# Patient Record
Sex: Female | Born: 1970 | Race: Black or African American | Hispanic: No | Marital: Single | Smoking: Never smoker
Health system: Southern US, Community
[De-identification: ages and names within clinical notes are randomized; demographics above are authoritative.]

## PROBLEM LIST (undated history)

## (undated) DIAGNOSIS — Z789 Other specified health status: Secondary | ICD-10-CM

---

## 2003-04-12 ENCOUNTER — Emergency Department (HOSPITAL_COMMUNITY): Admission: EM | Admit: 2003-04-12 | Discharge: 2003-04-12 | Payer: Self-pay | Admitting: Emergency Medicine

## 2003-08-30 ENCOUNTER — Other Ambulatory Visit: Admission: RE | Admit: 2003-08-30 | Discharge: 2003-08-30 | Payer: Self-pay | Admitting: Internal Medicine
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
## 2005-08-06 ENCOUNTER — Emergency Department (HOSPITAL_COMMUNITY): Admission: EM | Admit: 2005-08-06 | Discharge: 2005-08-06 | Payer: Self-pay | Admitting: *Deleted

## 2006-03-18 HISTORY — PX: MYOMECTOMY: SHX85

## 2011-03-04 ENCOUNTER — Other Ambulatory Visit: Payer: Self-pay | Admitting: Internal Medicine

## 2011-03-04 DIAGNOSIS — Z1231 Encounter for screening mammogram for malignant neoplasm of breast: Secondary | ICD-10-CM

## 2011-03-27 ENCOUNTER — Ambulatory Visit
Admission: RE | Admit: 2011-03-27 | Discharge: 2011-03-27 | Disposition: A | Discharge status: Home / Self Care | Payer: BC Managed Care – PPO | Source: Ambulatory Visit | Attending: Internal Medicine | Admitting: Internal Medicine

## 2011-03-27 DIAGNOSIS — Z1231 Encounter for screening mammogram for malignant neoplasm of breast: Secondary | ICD-10-CM

## 2011-04-10 ENCOUNTER — Other Ambulatory Visit: Payer: Self-pay | Admitting: Internal Medicine

## 2011-04-10 DIAGNOSIS — R928 Other abnormal and inconclusive findings on diagnostic imaging of breast: Secondary | ICD-10-CM

## 2011-04-17 ENCOUNTER — Ambulatory Visit
Admission: RE | Admit: 2011-04-17 | Discharge: 2011-04-17 | Disposition: A | Discharge status: Home / Self Care | Payer: BC Managed Care – PPO | Source: Ambulatory Visit | Attending: Internal Medicine | Admitting: Internal Medicine

## 2011-04-17 ENCOUNTER — Other Ambulatory Visit: Payer: Self-pay | Admitting: Internal Medicine

## 2011-04-17 DIAGNOSIS — R928 Other abnormal and inconclusive findings on diagnostic imaging of breast: Secondary | ICD-10-CM

## 2011-04-24 ENCOUNTER — Ambulatory Visit
Admission: RE | Admit: 2011-04-24 | Discharge: 2011-04-24 | Disposition: A | Discharge status: Home / Self Care | Payer: BC Managed Care – PPO | Source: Ambulatory Visit | Attending: Internal Medicine | Admitting: Internal Medicine

## 2011-04-24 ENCOUNTER — Other Ambulatory Visit: Payer: Self-pay | Admitting: Diagnostic Radiology

## 2011-04-24 DIAGNOSIS — R928 Other abnormal and inconclusive findings on diagnostic imaging of breast: Secondary | ICD-10-CM

## 2012-02-25 ENCOUNTER — Encounter (HOSPITAL_COMMUNITY): Payer: Self-pay | Admitting: Pharmacist

## 2012-03-03 ENCOUNTER — Encounter (HOSPITAL_COMMUNITY): Payer: Self-pay

## 2012-03-03 ENCOUNTER — Encounter (HOSPITAL_COMMUNITY)
Admission: RE | Admit: 2012-03-03 | Discharge: 2012-03-03 | Disposition: A | Discharge status: Home / Self Care | Payer: BC Managed Care – PPO | Source: Ambulatory Visit | Attending: Obstetrics and Gynecology | Admitting: Obstetrics and Gynecology

## 2012-03-03 HISTORY — DX: Other specified health status: Z78.9

## 2012-03-03 LAB — CBC
MCH: 24.2 pg — ABNORMAL LOW (ref 26.0–34.0)
Platelets: 303 10*3/uL (ref 150–400)
RBC: 4.42 MIL/uL (ref 3.87–5.11)
WBC: 5.7 10*3/uL (ref 4.0–10.5)

## 2012-03-03 LAB — SURGICAL PCR SCREEN: MRSA, PCR: NEGATIVE

## 2012-03-03 NOTE — Patient Instructions (Addendum)
[address] Albertina Senegal. [place]oski  03/03/2012   Your procedure is scheduled on:  03/09/12  Enter through the Main Entrance of [facility] at 6 AM.  Pick up the phone at the desk and dial [phone].   Call this number if you have problems the morning of surgery: [phone]   Remember:   Do not eat food:After Midnight.  Do not drink clear liquids: After Midnight.  Take these medicines the morning of surgery with A SIP OF WATER: NA   Do not wear jewelry, make-up or nail polish.  Do not wear lotions, powders, or perfumes. You may wear deodorant.  Do not shave 48 hours prior to surgery.  Do not bring valuables to the hospital.  Contacts, dentures or bridgework may not be worn into surgery.  Leave suitcase in the car. After surgery it may be brought to your room.  For patients admitted to the hospital, checkout time is 11:00 AM the day of discharge.   Patients discharged the day of surgery will not be allowed to drive home.  Name and phone number of your driver: NA  Special Instructions: Shower using CHG 2 nights before surgery and the night before surgery.  If you shower the day of surgery use CHG.  Use special wash - you have one bottle of CHG for all showers.  You should use approximately 1/3 of the bottle for each shower.   Please read over the following fact sheets that you were given: MRSA Information

## 2012-03-04 ENCOUNTER — Other Ambulatory Visit: Payer: Self-pay | Admitting: Obstetrics and Gynecology

## 2012-03-08 MED ORDER — CEFAZOLIN SODIUM-DEXTROSE 2-3 GM-% IV SOLR: 2.0000 g | INTRAVENOUS | Status: DC

## 2012-03-09 ENCOUNTER — Encounter (HOSPITAL_COMMUNITY): Payer: Self-pay

## 2012-03-09 ENCOUNTER — Ambulatory Visit (HOSPITAL_COMMUNITY): Payer: BC Managed Care – PPO

## 2012-03-09 ENCOUNTER — Encounter (HOSPITAL_COMMUNITY)
Admission: RE | Disposition: A | Discharge status: Home / Self Care | Payer: Self-pay | Source: Ambulatory Visit | Attending: Obstetrics and Gynecology

## 2012-03-09 ENCOUNTER — Ambulatory Visit (HOSPITAL_COMMUNITY)
Admission: RE | Admit: 2012-03-09 | Discharge: 2012-03-09 | Disposition: A | Discharge status: Home / Self Care | Payer: BC Managed Care – PPO | Source: Ambulatory Visit | Attending: Obstetrics and Gynecology | Admitting: Obstetrics and Gynecology

## 2012-03-09 DIAGNOSIS — N920 Excessive and frequent menstruation with regular cycle: Secondary | ICD-10-CM | POA: Insufficient documentation

## 2012-03-09 DIAGNOSIS — D252 Subserosal leiomyoma of uterus: Secondary | ICD-10-CM | POA: Insufficient documentation

## 2012-03-09 DIAGNOSIS — D250 Submucous leiomyoma of uterus: Secondary | ICD-10-CM | POA: Insufficient documentation

## 2012-03-09 DIAGNOSIS — Z01812 Encounter for preprocedural laboratory examination: Secondary | ICD-10-CM | POA: Insufficient documentation

## 2012-03-09 DIAGNOSIS — Z01818 Encounter for other preprocedural examination: Secondary | ICD-10-CM | POA: Insufficient documentation

## 2012-03-09 DIAGNOSIS — D251 Intramural leiomyoma of uterus: Secondary | ICD-10-CM | POA: Insufficient documentation

## 2012-03-09 DIAGNOSIS — Z90710 Acquired absence of both cervix and uterus: Secondary | ICD-10-CM

## 2012-03-09 HISTORY — PX: ROBOTIC ASSISTED TOTAL HYSTERECTOMY: SHX6085

## 2012-03-09 HISTORY — PX: BILATERAL SALPINGECTOMY: SHX5743

## 2012-03-09 LAB — BASIC METABOLIC PANEL
BUN: 7 mg/dL (ref 6–23)
CO2: 28 mEq/L (ref 19–32)
CO2: 25 mEq/L (ref 19–32)
Chloride: 101 mEq/L (ref 96–112)
Creatinine, Ser: 0.76 mg/dL (ref 0.50–1.10)
GFR calc non Af Amer: >90 mL/min (ref >90)
Glucose, Bld: 189 mg/dL — ABNORMAL HIGH (ref 70–99)
Potassium: 3.8 mEq/L (ref 3.5–5.1)
Potassium: 3.9 mEq/L (ref 3.5–5.1)

## 2012-03-09 LAB — CBC
HCT: 33 % — ABNORMAL LOW (ref 36.0–46.0)
Hemoglobin: 10.4 g/dL — ABNORMAL LOW (ref 12.0–15.0)
Hemoglobin: 9.4 g/dL — ABNORMAL LOW (ref 12.0–15.0)
MCH: 24.2 pg — ABNORMAL LOW (ref 26.0–34.0)
MCHC: 31.8 g/dL (ref 30.0–36.0)
MCV: 76.4 fL — ABNORMAL LOW (ref 78.0–100.0)
MCV: 76.3 fL — ABNORMAL LOW (ref 78.0–100.0)
RDW: 18.1 % — ABNORMAL HIGH (ref 11.5–15.5)
WBC: 6 10*3/uL (ref 4.0–10.5)

## 2012-03-09 SURGERY — ROBOTIC ASSISTED TOTAL HYSTERECTOMY
Anesthesia: General | Site: Abdomen | Laterality: Bilateral | Wound class: Clean Contaminated

## 2012-03-09 MED ORDER — DEXTROSE IN LACTATED RINGERS 5 % IV SOLN
INTRAVENOUS | Status: DC
  Administered 2012-03-09: 14:00:00 via INTRAVENOUS

## 2012-03-09 MED ORDER — PROPOFOL 10 MG/ML IV EMUL
INTRAVENOUS | Status: DC | PRN
  Administered 2012-03-09: 200 mg via INTRAVENOUS

## 2012-03-09 MED ORDER — IBUPROFEN 800 MG PO TABS: 800.0000 mg | ORAL_TABLET | Freq: Three times a day (TID) | ORAL | Status: DC | PRN

## 2012-03-09 MED ORDER — LIDOCAINE HCL (CARDIAC) 20 MG/ML IV SOLN
INTRAVENOUS | Status: DC | PRN
  Administered 2012-03-09: 100 mg via INTRAVENOUS

## 2012-03-09 MED ORDER — OXYCODONE-ACETAMINOPHEN 5-325 MG PO TABS: 1.0000 | ORAL_TABLET | ORAL | Status: DC | PRN

## 2012-03-09 MED ORDER — GLYCOPYRROLATE 0.2 MG/ML IJ SOLN
INTRAMUSCULAR | Status: AC
  Filled 2012-03-09: qty 3

## 2012-03-09 MED ORDER — ARTIFICIAL TEARS OP OINT
TOPICAL_OINTMENT | OPHTHALMIC | Status: AC
  Filled 2012-03-09: qty 3.5

## 2012-03-09 MED ORDER — FENTANYL CITRATE 0.05 MG/ML IJ SOLN
INTRAMUSCULAR | Status: AC
  Filled 2012-03-09: qty 2

## 2012-03-09 MED ORDER — MIDAZOLAM HCL 2 MG/2ML IJ SOLN
INTRAMUSCULAR | Status: AC
Start: 2012-03-09 — End: 2012-03-09
  Filled 2012-03-09: qty 2

## 2012-03-09 MED ORDER — ONDANSETRON HCL 4 MG/2ML IJ SOLN
INTRAMUSCULAR | Status: DC | PRN
  Administered 2012-03-09: 4 mg via INTRAVENOUS

## 2012-03-09 MED ORDER — FENTANYL CITRATE 0.05 MG/ML IJ SOLN
INTRAMUSCULAR | Status: AC
  Filled 2012-03-09: qty 5

## 2012-03-09 MED ORDER — MENTHOL 3 MG MT LOZG: 1.0000 | LOZENGE | OROMUCOSAL | Status: DC | PRN

## 2012-03-09 MED ORDER — PANTOPRAZOLE SODIUM 40 MG PO TBEC
40.0000 mg | DELAYED_RELEASE_TABLET | Freq: Every day | ORAL | Status: DC
  Filled 2012-03-09 (×2): qty 1

## 2012-03-09 MED ORDER — HYDROMORPHONE HCL 2 MG PO TABS: 4.0000 mg | ORAL_TABLET | ORAL | Status: DC | PRN

## 2012-03-09 MED ORDER — ONDANSETRON HCL 4 MG PO TABS: 4.0000 mg | ORAL_TABLET | Freq: Four times a day (QID) | ORAL | Status: DC | PRN

## 2012-03-09 MED ORDER — HYDROMORPHONE HCL PF 1 MG/ML IJ SOLN
0.2500 mg | INTRAMUSCULAR | Status: DC | PRN
  Administered 2012-03-09 (×2): 0.5 mg via INTRAVENOUS

## 2012-03-09 MED ORDER — LACTATED RINGERS IV SOLN
INTRAVENOUS | Status: DC
  Administered 2012-03-09: 06:00:00 via INTRAVENOUS

## 2012-03-09 MED ORDER — BUPIVACAINE HCL (PF) 0.25 % IJ SOLN
INTRAMUSCULAR | Status: DC | PRN
  Administered 2012-03-09: 11 mL

## 2012-03-09 MED ORDER — PROPOFOL 10 MG/ML IV EMUL
INTRAVENOUS | Status: AC
  Filled 2012-03-09: qty 20

## 2012-03-09 MED ORDER — LIDOCAINE HCL (CARDIAC) 20 MG/ML IV SOLN: INTRAVENOUS | Status: DC | PRN

## 2012-03-09 MED ORDER — FENTANYL CITRATE 0.05 MG/ML IJ SOLN
INTRAMUSCULAR | Status: DC | PRN
  Administered 2012-03-09: 50 ug via INTRAVENOUS
  Administered 2012-03-09: 200 ug via INTRAVENOUS
  Administered 2012-03-09: 50 ug via INTRAVENOUS

## 2012-03-09 MED ORDER — ZOLPIDEM TARTRATE 5 MG PO TABS: 5.0000 mg | ORAL_TABLET | Freq: Every evening | ORAL | Status: DC | PRN

## 2012-03-09 MED ORDER — ONDANSETRON HCL 4 MG/2ML IJ SOLN
4.0000 mg | Freq: Four times a day (QID) | INTRAMUSCULAR | Status: DC | PRN
  Administered 2012-03-09: 4 mg via INTRAVENOUS
  Filled 2012-03-09: qty 2

## 2012-03-09 MED ORDER — HYDROMORPHONE HCL PF 1 MG/ML IJ SOLN: 0.2000 mg | INTRAMUSCULAR | Status: DC | PRN

## 2012-03-09 MED ORDER — ROCURONIUM BROMIDE 100 MG/10ML IV SOLN
INTRAVENOUS | Status: DC | PRN
  Administered 2012-03-09: 50 mg via INTRAVENOUS
  Administered 2012-03-09 (×2): 5 mg via INTRAVENOUS

## 2012-03-09 MED ORDER — EPHEDRINE SULFATE 50 MG/ML IJ SOLN
INTRAMUSCULAR | Status: DC | PRN
  Administered 2012-03-09 (×3): 5 mg via INTRAVENOUS

## 2012-03-09 MED ORDER — CEFAZOLIN SODIUM-DEXTROSE 2-3 GM-% IV SOLR
INTRAVENOUS | Status: AC
  Administered 2012-03-09: 2 g via INTRAVENOUS
  Filled 2012-03-09: qty 50

## 2012-03-09 MED ORDER — KETOROLAC TROMETHAMINE 30 MG/ML IJ SOLN
30.0000 mg | Freq: Four times a day (QID) | INTRAMUSCULAR | Status: DC
  Administered 2012-03-09: 30 mg via INTRAVENOUS

## 2012-03-09 MED ORDER — HYDROMORPHONE HCL 4 MG PO TABS: 4.0000 mg | ORAL_TABLET | ORAL | Status: DC | PRN

## 2012-03-09 MED ORDER — PROPOFOL 10 MG/ML IV BOLUS: INTRAVENOUS | Status: DC | PRN

## 2012-03-09 MED ORDER — NEOSTIGMINE METHYLSULFATE 1 MG/ML IJ SOLN
INTRAMUSCULAR | Status: AC
  Filled 2012-03-09: qty 1

## 2012-03-09 MED ORDER — IBUPROFEN 800 MG PO TABS: 800.0000 mg | ORAL_TABLET | Freq: Four times a day (QID) | ORAL | Status: DC | PRN

## 2012-03-09 MED ORDER — BUPIVACAINE HCL (PF) 0.25 % IJ SOLN
INTRAMUSCULAR | Status: AC
  Filled 2012-03-09: qty 30

## 2012-03-09 MED ORDER — LACTATED RINGERS IR SOLN
Status: DC | PRN
  Administered 2012-03-09: 3000 mL

## 2012-03-09 MED ORDER — KETOROLAC TROMETHAMINE 30 MG/ML IJ SOLN
INTRAMUSCULAR | Status: AC
  Filled 2012-03-09: qty 1

## 2012-03-09 MED ORDER — MIDAZOLAM HCL 5 MG/5ML IJ SOLN
INTRAMUSCULAR | Status: DC | PRN
  Administered 2012-03-09: 2 mg via INTRAVENOUS

## 2012-03-09 MED ORDER — EPHEDRINE 5 MG/ML INJ
INTRAVENOUS | Status: AC
  Filled 2012-03-09: qty 10

## 2012-03-09 MED ORDER — ACETAMINOPHEN 10 MG/ML IV SOLN
INTRAVENOUS | Status: AC
  Administered 2012-03-09: 1000 mg via INTRAVENOUS
  Filled 2012-03-09: qty 100

## 2012-03-09 MED ORDER — ARTIFICIAL TEARS OP OINT
TOPICAL_OINTMENT | OPHTHALMIC | Status: DC | PRN
  Administered 2012-03-09: 1 via OPHTHALMIC

## 2012-03-09 MED ORDER — KETOROLAC TROMETHAMINE 30 MG/ML IJ SOLN: 30.0000 mg | Freq: Four times a day (QID) | INTRAMUSCULAR | Status: DC

## 2012-03-09 MED ORDER — ONDANSETRON HCL 4 MG/2ML IJ SOLN
INTRAMUSCULAR | Status: AC
  Filled 2012-03-09: qty 2

## 2012-03-09 MED ORDER — HYDROMORPHONE HCL PF 1 MG/ML IJ SOLN
INTRAMUSCULAR | Status: AC
  Administered 2012-03-09: 0.5 mg via INTRAVENOUS
  Filled 2012-03-09: qty 1

## 2012-03-09 SURGICAL SUPPLY — 63 items
BAG URINE DRAINAGE (UROLOGICAL SUPPLIES) ×2 IMPLANT
BARRIER ADHS 3X4 INTERCEED (GAUZE/BANDAGES/DRESSINGS) IMPLANT
CABLE HIGH FREQUENCY MONO STRZ (ELECTRODE) ×2 IMPLANT
CATH FOLEY 3WAY  5CC 16FR (CATHETERS) ×1
CATH FOLEY 3WAY 5CC 16FR (CATHETERS) ×1 IMPLANT
CHLORAPREP W/TINT 26ML (MISCELLANEOUS) ×2 IMPLANT
CLOTH BEACON ORANGE TIMEOUT ST (SAFETY) ×2 IMPLANT
CONT PATH 16OZ SNAP LID 3702 (MISCELLANEOUS) ×2 IMPLANT
COVER MAYO STAND STRL (DRAPES) ×2 IMPLANT
COVER TABLE BACK 60X90 (DRAPES) ×4 IMPLANT
COVER TIP SHEARS 8 DVNC (MISCELLANEOUS) ×1 IMPLANT
COVER TIP SHEARS 8MM DA VINCI (MISCELLANEOUS) ×1
DECANTER SPIKE VIAL GLASS SM (MISCELLANEOUS) ×2 IMPLANT
DERMABOND ADVANCED (GAUZE/BANDAGES/DRESSINGS) ×1
DERMABOND ADVANCED .7 DNX12 (GAUZE/BANDAGES/DRESSINGS) ×1 IMPLANT
DEVICE TROCAR PUNCTURE CLOSURE (ENDOMECHANICALS) IMPLANT
DRAPE HUG U DISPOSABLE (DRAPE) ×2 IMPLANT
DRAPE LG THREE QUARTER DISP (DRAPES) ×4 IMPLANT
DRAPE WARM FLUID 44X44 (DRAPE) ×2 IMPLANT
ELECT REM PT RETURN 9FT ADLT (ELECTROSURGICAL) ×2
ELECTRODE REM PT RTRN 9FT ADLT (ELECTROSURGICAL) ×1 IMPLANT
EVACUATOR SMOKE 8.L (FILTER) ×2 IMPLANT
GAUZE VASELINE 3X9 (GAUZE/BANDAGES/DRESSINGS) IMPLANT
GLOVE BIO SURGEON STRL SZ 6.5 (GLOVE) ×4 IMPLANT
GLOVE BIOGEL PI IND STRL 7.0 (GLOVE) ×3 IMPLANT
GLOVE BIOGEL PI INDICATOR 7.0 (GLOVE) ×3
GOWN STRL REIN XL XLG (GOWN DISPOSABLE) ×12 IMPLANT
KIT ACCESSORY DA VINCI DISP (KITS) ×1
KIT ACCESSORY DVNC DISP (KITS) ×1 IMPLANT
LEGGING LITHOTOMY PAIR STRL (DRAPES) ×2 IMPLANT
NEEDLE INSUFFLATION 14GA 120MM (NEEDLE) ×2 IMPLANT
OCCLUDER COLPOPNEUMO (BALLOONS) IMPLANT
PACK LAVH (CUSTOM PROCEDURE TRAY) ×2 IMPLANT
PAD PREP 24X48 CUFFED NSTRL (MISCELLANEOUS) ×4 IMPLANT
PLUG CATH AND CAP STER (CATHETERS) ×2 IMPLANT
PROTECTOR NERVE ULNAR (MISCELLANEOUS) ×4 IMPLANT
SCISSORS LAP 5X35 DISP (ENDOMECHANICALS) IMPLANT
SET CYSTO W/LG BORE CLAMP LF (SET/KITS/TRAYS/PACK) IMPLANT
SET IRRIG TUBING LAPAROSCOPIC (IRRIGATION / IRRIGATOR) ×2 IMPLANT
SOLUTION ELECTROLUBE (MISCELLANEOUS) ×2 IMPLANT
SUT VIC AB 0 CT1 27 (SUTURE) ×3
SUT VIC AB 0 CT1 27XBRD ANTBC (SUTURE) ×3 IMPLANT
SUT VICRYL 0 27 CT2 27 ABS (SUTURE) ×10 IMPLANT
SUT VICRYL 0 UR6 27IN ABS (SUTURE) ×2 IMPLANT
SUT VICRYL 4-0 PS2 18IN ABS (SUTURE) ×8 IMPLANT
SYR 50ML LL SCALE MARK (SYRINGE) ×2 IMPLANT
SYRINGE 10CC LL (SYRINGE) ×2 IMPLANT
SYSTEM CONVERTIBLE TROCAR (TROCAR) IMPLANT
TIP RUMI ORANGE 6.7MMX12CM (TIP) IMPLANT
TIP UTERINE 5.1X6CM LAV DISP (MISCELLANEOUS) ×2 IMPLANT
TIP UTERINE 6.7X10CM GRN DISP (MISCELLANEOUS) IMPLANT
TIP UTERINE 6.7X6CM WHT DISP (MISCELLANEOUS) ×2 IMPLANT
TIP UTERINE 6.7X8CM BLUE DISP (MISCELLANEOUS) ×2 IMPLANT
TOWEL OR 17X24 6PK STRL BLUE (TOWEL DISPOSABLE) ×6 IMPLANT
TROCAR 12M 150ML BLUNT (TROCAR) IMPLANT
TROCAR DISP BLADELESS 8 DVNC (TROCAR) ×1 IMPLANT
TROCAR DISP BLADELESS 8MM (TROCAR) ×1
TROCAR XCEL 12X100 BLDLESS (ENDOMECHANICALS) ×2 IMPLANT
TROCAR XCEL NON-BLD 5MMX100MML (ENDOMECHANICALS) ×2 IMPLANT
TROCAR Z-THREAD 12X150 (TROCAR) ×2 IMPLANT
TUBING FILTER THERMOFLATOR (ELECTROSURGICAL) ×2 IMPLANT
WARMER LAPAROSCOPE (MISCELLANEOUS) ×2 IMPLANT
WATER STERILE IRR 1000ML POUR (IV SOLUTION) ×6 IMPLANT

## 2012-03-09 NOTE — Anesthesia Preprocedure Evaluation (Addendum)
Anesthesia Evaluation  Patient identified by MRN, date of birth, ID band Patient awake    Reviewed: Allergy & Precautions, H&P , NPO status , Patient's Chart, lab work & pertinent test results, reviewed documented beta blocker date and time   History of Anesthesia Complications Negative for: history of anesthetic complications  Airway Mallampati: II TM Distance: >3 FB Neck ROM: full    Dental No notable dental hx. (+) Teeth Intact   Pulmonary neg pulmonary ROS,  breath sounds clear to auscultation  Pulmonary exam normal       Cardiovascular Exercise Tolerance: Good negative cardio ROS  Rhythm:regular Rate:Normal     Neuro/Psych negative neurological ROS  negative psych ROS   GI/Hepatic negative GI ROS, Neg liver ROS,   Endo/Other  negative endocrine ROS  Renal/GU negative Renal ROS  Female GU complaint     Musculoskeletal   Abdominal   Peds  Hematology  (+) anemia ,   Anesthesia Other Findings   Reproductive/Obstetrics negative OB ROS                          Anesthesia Physical Anesthesia Plan  ASA: II  Anesthesia Plan: General ETT   Post-op Pain Management:    Induction: Intravenous  Airway Management Planned: Oral ETT  Additional Equipment:   Intra-op Plan:   Post-operative Plan:   Informed Consent: I have reviewed the patients History and Physical, chart, labs and discussed the procedure including the risks, benefits and alternatives for the proposed anesthesia with the patient or authorized representative who has indicated his/her understanding and acceptance.   Dental Advisory Given and Dental advisory given  Plan Discussed with: CRNA and Surgeon  Anesthesia Plan Comments: (  Discussed  general anesthesia, including possible nausea, instrumentation of airway, sore throat,pulmonary aspiration, etc. I asked if the were any outstanding questions, or  concerns before  we proceeded. )       Anesthesia Quick Evaluation

## 2012-03-09 NOTE — Anesthesia Postprocedure Evaluation (Signed)
  Anesthesia Post-op Note  Patient: Roberta Mejia. [place]land  Procedure(s) Performed: Procedure(s) (LRB) with comments: ROBOTIC ASSISTED TOTAL HYSTERECTOMY (Bilateral) - 2 1/2 hrs. BILATERAL SALPINGECTOMY (Bilateral)  Patient Location: PACU and Women's Unit  Anesthesia Type:General  Level of Consciousness: awake, alert , oriented and patient cooperative  Airway and Oxygen Therapy: Patient Spontanous Breathing and Patient connected to nasal cannula oxygen  Post-op Pain: mild  Post-op Assessment: Post-op Vital signs reviewed, Patient's Cardiovascular Status Stable, Respiratory Function Stable, Patent Airway, No signs of Nausea or vomiting and Adequate PO intake  Post-op Vital Signs: Reviewed and stable  Complications: No apparent anesthesia complications

## 2012-03-09 NOTE — Addendum Note (Signed)
Addendum  created 03/09/12 1715 by Collier Flowers, CRNA   Modules edited:Notes Section

## 2012-03-09 NOTE — Progress Notes (Signed)
Pt. Is discharged in the care of Father. Downstairs per wheel chair. Stable. L abdominal lapsites are clean and dry. Scanty amt of vaginal bleeding noted on VPad. Discharge instructions were given , with good pt understanding. Questions were asked and answered.

## 2012-03-09 NOTE — Brief Op Note (Signed)
03/09/2012  10:21 AM  PATIENT:  Roberta Mejia. Pierotti  41 y.o. female  PRE-OPERATIVE DIAGNOSIS:  Fibroid uterus, Menorrhagia  POST-OPERATIVE DIAGNOSIS:  fibroid uterus,menorrhagia  PROCEDURE:  DaVinci robotic total hysterectomy, bilateral salpingectomy  SURGEON:  Surgeon(s) and Role:    * Serita Kyle, MD - Primary  PHYSICIAN ASSISTANT:   ASSISTANTS: Shea Evans, M.D.   ANESTHESIA:   general FINDINGS; nl tubes and ovaries, shortened uteroovarian ligaments, fibroid uterus, bowel adherent to bladder peritoneum on the right, appendix w/ some periappendiceal adhesions, nl liver edge EBL:  Total I/O In: 500 [I.V.:500] Out: 100 [Urine:100]  BLOOD ADMINISTERED:none  DRAINS: none   LOCAL MEDICATIONS USED:  MARCAINE     SPECIMEN:  Source of Specimen:  uterus with cervix, tubes  DISPOSITION OF SPECIMEN:  PATHOLOGY  COUNTS:  YES  TOURNIQUET:  * No tourniquets in log *  DICTATION: .Other Dictation: Dictation Number [id number]  PLAN OF CARE: Discharge to home after PACU  PATIENT DISPOSITION:  PACU - hemodynamically stable.   Delay start of Pharmacological VTE agent (>24hrs) due to surgical blood loss or risk of bleeding: no

## 2012-03-09 NOTE — Anesthesia Postprocedure Evaluation (Signed)
  Anesthesia Post-op Note  Patient: Roberta Mejia. [place]ciaga  Procedure(s) Performed: Procedure(s) (LRB) with comments: ROBOTIC ASSISTED TOTAL HYSTERECTOMY (Bilateral) - 2 1/2 hrs. BILATERAL SALPINGECTOMY (Bilateral) Patient is awake and responsive. Pain and nausea are reasonably well controlled. Vital signs are stable and clinically acceptable. Oxygen saturation is clinically acceptable. There are no apparent anesthetic complications at this time. Patient is ready for discharge.

## 2012-03-09 NOTE — Addendum Note (Signed)
Addendum  created 03/09/12 1225 by Armanda Heritage, RN   Modules edited:Charges VN

## 2012-03-09 NOTE — Transfer of Care (Signed)
Immediate Anesthesia Transfer of Care Note  Patient: Roberta Mejia. [place]  Procedure(s) Performed: Procedure(s) (LRB) with comments: ROBOTIC ASSISTED TOTAL HYSTERECTOMY (Bilateral) - 2 1/2 hrs. BILATERAL SALPINGECTOMY (Bilateral)  Patient Location: PACU  Anesthesia Type:General  Level of Consciousness: awake, alert  and oriented  Airway & Oxygen Therapy: Patient Spontanous Breathing and Patient connected to nasal cannula oxygen  Post-op Assessment: Report given to PACU RN  Post vital signs: Reviewed and stable  Complications: No apparent anesthesia complications

## 2012-03-09 NOTE — Addendum Note (Signed)
Addendum  created 03/09/12 1225 by Nylia Gavina M Christophr Calix, RN   Modules edited:Charges VN    

## 2012-03-09 NOTE — Anesthesia Procedure Notes (Signed)
Procedure Name: Intubation Date/Time: 03/09/2012 7:41 AM Performed by: Aydan Levitz, Jannet Askew Pre-anesthesia Checklist: Patient identified, Patient being monitored, Emergency Drugs available, Timeout performed and Suction available Patient Re-evaluated:Patient Re-evaluated prior to inductionOxygen Delivery Method: Circle system utilized Preoxygenation: Pre-oxygenation with 100% oxygen Intubation Type: IV induction Ventilation: Mask ventilation without difficulty Grade View: Grade II Tube type: Oral Tube size: 7.0 mm Placement Confirmation: ETT inserted through vocal cords under direct vision,  positive ETCO2,  CO2 detector and breath sounds checked- equal and bilateral Secured at: 22 cm Dental Injury: Teeth and Oropharynx as per pre-operative assessment

## 2012-03-10 NOTE — Op Note (Signed)
NAMEBENTLY, MORATH              ACCOUNT NO.:  [id number]  MEDICAL RECORD NO.:  [id number]  LOCATION:  9305                          FACILITY:  [facility]  PHYSICIAN:  Maxie Better, M.D.DATE OF BIRTH:  12-26-1970  DATE OF PROCEDURE:  03/09/2012 DATE OF DISCHARGE:  03/09/2012                              OPERATIVE REPORT   PREOPERATIVE DIAGNOSES:  Menorrhagia, uterine fibroids.  PROCEDURE:  Da Vinci robotic total hysterectomy, bilateral salpingectomy.  POSTOPERATIVE DIAGNOSES:  Menorrhagia, uterine fibroids, bowel adhesions.  ANESTHESIA:  General.  SURGEON:  Maxie Better, M.D.  ASSISTANT:  Darryl Nestle, MD.  PROCEDURE IN DETAIL:  Under adequate general anesthesia, the patient was placed in the dorsal lithotomy position.  She was positioned for robotic surgery.  Examination under anesthesia revealed a 10-12 week size, irregular, anteverted uterus.  No adnexal masses could be appreciated. The patient was ChloraPrep and Betadine prepped in the usual fashion and the patient was draped.  A three-way indwelling Foley catheter was placed.  A weighted speculum placed in the vagina.  Sims retractor was placed anteriorly.  The 0 Vicryl figure-of-eight sutures were placed on the anterior and posterior lip of the cervix.  The uterus sounded to 7 cm.  The initial placement of a #6 uterine manipulator resulted in a balloon bursting, therefore, the cervix was further dilated and sounded again.  An #8 uterine manipulator was introduced through the small RUMI KOH ring for manipulation of the uterus.  The weighted- Speculum and  retractor was removed.  Attention was then turned to the abdomen.  A 0.25% Marcaine was injected along the planned incision site. A vertical suprapubic incision was then made.  Veress needle was introduced, tested.  Opening pressure of 3 was noted.  A 3.5 L of CO2 was insufflated.  Veress needle was then removed.  A #12 mm disposable trocar and sleeve was  introduced into the abdomen without incident.  The robotic camera was then placed through that port.  The patient was advanced on the table and then placed in deep Trendelenburg.  Normal liver edges noted.  The uterus was irregular with evidence of fibroids.  There was a loop of small bowel draped over the anterior right aspect of the uterus.  At that point, two 8 mm port site was placed 10 cm apart on the left and one 8 mm port site was placed on the right with a 5-mm assistant port being placed, all under direct visualization.  The robot was then docked to the patient's left side and the #1 arm had the monopolar scissors place, in #2 arm had a probe PK dissector, and #3 arm had the Prograsp placed.  I then went to the surgical console.  At the surgical console, the pelvis was inspected.  Indeed, the bowels was draped over the uterus on the right and attached to the peritoneum of the bladder laterally.  The tubes and ovaries were normal bilaterally. The procedure was began by carefully dissected off the bowels off of its adhesion to the bladder.  Once that was done, attention was then turned to the right side where the ureter was noted to be seen peristalsing. Nonetheless, the retroperitoneal space was opened confirming  peristalsis of the ureter and its location deep in the pelvis.  The mesosalpinx of the right tube was then serially clamped, cauterized, and then cut, followed by a window was placed in the posterior leaf and the utero- ovarian ligament, which was short was serially clamped, cauterized, and then cut.  Once that was done, the further dissection was done to displace the ureter further inferiorly.  The round ligament on the right was then serially clamped, cauterized, and cut, and anterior leaf of the broad ligament at that point was also opened and carried over to the bladder.  Vesicouterine peritoneum was then sharply dissected.  The uterine vessels were noted to be  provident and skeletonized.  The bladder was dissected off the lower uterine segment and a sharp dissection and displaced inferiorly.  The uterine vessels on the right was clamped, cauterized, but not cut initially.  Continued dissection occurred on the right side, and further cauterization the uterine vessels were then performed.  The uterine vessels were then cut. Attention was turned to the opposite side where again the retroperitoneal space on the left was opened.  Blunt dissection was performed.  The ureter was then found to peristalsing and low in the pelvis.  The mesosalpinx of the left fallopian tube was then serially clamped, cauterized, and then cut until the utero-ovarian ligament was reached which was very short and therefore a window was placed in the peritoneum way above the ureter. The utero-ovarian ligaments were then serially clamped, cauterized, and then cut until that ovary was separated from its uterine attachment.  The uterine vessels on the left was then skeletonized.  Reinspection confirmed the ureter was deep the pelvis.  The remaining vesicouterine peritoneum was opened transversely and sharply dissected off the lower uterine segment.  Uterine vessels on the left were serially clamped, cauterized, and then cut.  The cup of the KOH ring was identified and circumferential incision was then made with the cervix detached from the vagina.  The specimen was then brought out through the vagina.  The vaginal cuff was carefully cauterized for bleeders. The bladder was again sharply dissected off that portion of the vaginal cuff and  displaced inferiorly.  At that point, the vaginal cuff was noted to be hemostased.  The PK and the monopolar scissors were removed and were replaced by a long-tip forceps and a large needle driver.  Zero Vicryl figure-of-eight sutures was used to close the vaginal cuff transversely. Once this was done, the pelvis was not irrigated as the needles  remained on the part on the sidewalls.  The small amount of fluid was removed. The hemostasis was achieved.  The appendix was noted to have some periappendiceal scar tissue but otherwise normal.  The robotic instruments were then removed.  The robot was undocked, and I went back to the patient's bedside.  The pelvis was irrigated, suctioned of debris. Small bleeding on the left ovarian pedicle was cauterized.  The needles x8 were removed using 8 mm scope and irrigation and suctioned of the debris was done.  The port sites were then removed under direct visualization. Abdomen was deflated and the incision was closed with 0 Vicryl, figure- of-eight suture for the fascial layer.  Remaining incisions were closed with 4-0 Vicryl subcuticular stitches.  Bimanual palpation of the vaginal cuff showed it to be well-approximated.  SPECIMEN:  Uterus, cervix, fallopian tubes x2, sent to pathology.  ESTIMATED BLOOD LOSS:  Less than 50 mL.  INTRAOPERATIVE FLUID:  500 mL.  URINE  OUTPUT:  100 mL clear yellow urine.  COUNTS:  Sponge and instrument counts x2 was correct.  COMPLICATIONS:  None.  The patient tolerated the procedure well was transferred to recovery in stable condition.    Maxie Better, M.D.    [place]/MEDQ  D:  03/09/2012  T:  03/10/2012  Job:  [id number]

## 2012-03-13 ENCOUNTER — Encounter (HOSPITAL_COMMUNITY): Payer: Self-pay | Admitting: Obstetrics and Gynecology

## 2018-03-26 DIAGNOSIS — E669 Obesity, unspecified: Secondary | ICD-10-CM | POA: Insufficient documentation

## 2019-06-02 ENCOUNTER — Other Ambulatory Visit: Payer: Self-pay

## 2019-06-02 ENCOUNTER — Encounter: Payer: Self-pay | Admitting: Cardiovascular Disease

## 2019-06-02 ENCOUNTER — Ambulatory Visit: Payer: BC Managed Care – PPO | Admitting: Cardiovascular Disease

## 2019-06-02 VITALS — BP 138/88 | HR 78 | Ht 65.0 in | Wt 200.0 lb

## 2019-06-02 DIAGNOSIS — I10 Essential (primary) hypertension: Secondary | ICD-10-CM | POA: Diagnosis not present

## 2019-06-02 HISTORY — DX: Essential (primary) hypertension: I10

## 2019-06-02 NOTE — Progress Notes (Signed)
Hypertension Clinic Initial Assessment:    Date:  06/02/2019   ID:  Roberta Mejia. Roberta, Mejia June 23, 1970, MRN [id number]  PCP:  Gregor Hams, FNP  Cardiologist:  No primary care provider on file.  Nephrologist:  Referring MD: Reeves Dam, MD   CC: Hypertension  History of Present Illness:    Roberta Pezzuti. Mejia is a 49 y.o. female with a hx of hypertension here to establish care in the hypertension clinic. She was initially diagnosed with hypertension approximately one year ago.  Her BP has been somewhat labile.  It was previously on just losartan but HCTZ was added approximately October or November 2020.  Since the she saw her dermatologist late 01/2019 and it was 166/95.  When she saw Dr. Garwin Brothers on 05/27/19 and her BP was 140/74.  She referred her to Hypertension Clinic for more intensive management.  Roberta Mejia notes that she isn't getting much exercise.  She works long hours at [employer].  She wants to start walking more.  She denies exertional chest pain or shortness of breath.  She has no lower extremity edema, orthopnea or PND.  She notes that her diet could be better and she doesn't like to cook.   Past Medical History:  Diagnosis Date  . Essential hypertension 06/02/2019  . No pertinent past medical history     Past Surgical History:  Procedure Laterality Date  . BILATERAL SALPINGECTOMY  03/09/2012   Procedure: BILATERAL SALPINGECTOMY;  Surgeon: Marvene Staff, MD;  Location: [facility];  Service: Gynecology;  Laterality: Bilateral;  . MYOMECTOMY  2008  . ROBOTIC ASSISTED TOTAL HYSTERECTOMY  03/09/2012   Procedure: ROBOTIC ASSISTED TOTAL HYSTERECTOMY;  Surgeon: Marvene Staff, MD;  Location: [facility];  Service: Gynecology;  Laterality: Bilateral;  2 1/2 hrs.    Current Medications: Current Meds  Medication Sig  . losartan-hydrochlorothiazide (HYZAAR) 100-25 MG tablet Take by mouth.  . Omega-3 Fatty Acids (FISH OIL) 1000 MG CAPS Take by mouth.      Allergies:   Patient has no known allergies.   Social History   Socioeconomic History  . Marital status: Single    Spouse name: Not on file  . Number of children: Not on file  . Years of education: Not on file  . Highest education level: Not on file  Occupational History  . Not on file  Tobacco Use  . Smoking status: Never Smoker  Substance and Sexual Activity  . Alcohol use: Yes    Comment: occasionally  . Drug use: No  . Sexual activity: Not on file  Other Topics Concern  . Not on file  Social History Narrative  . Not on file   Social Determinants of Health   Financial Resource Strain:   . Difficulty of Paying Living Expenses:   Food Insecurity:   . Worried About Charity fundraiser in the Last Year:   . Arboriculturist in the Last Year:   Transportation Needs:   . Film/video editor (Medical):   Marland Kitchen Lack of Transportation (Non-Medical):   Physical Activity:   . Days of Exercise per Week:   . Minutes of Exercise per Session:   Stress:   . Feeling of Stress :   Social Connections:   . Frequency of Communication with Friends and Family:   . Frequency of Social Gatherings with Friends and Family:   . Attends Religious Services:   . Active Member of Clubs or Organizations:   . Attends  Club or Organization Meetings:   Marland Kitchen Marital Status:      Family History: The patient's family history includes Breast cancer in her mother; Eczema in her daughter; Heart attack in her maternal grandmother and paternal grandmother; Heart failure in her maternal grandfather; Hyperlipidemia in her father; Hypertension in her mother; Kidney disease in her paternal grandfather.  ROS:   Please see the history of present illness.     All other systems reviewed and are negative.  EKGs/Labs/Other Studies Reviewed:    EKG:  EKG is ordered today.  The ekg ordered today demonstrates sinus rhythm.  Rate 78 bpm.    Recent Labs: No results found for requested labs within last 8760 hours.    Recent Lipid Panel No results found for: CHOL, TRIG, HDL, CHOLHDL, VLDL, LDLCALC, LDLDIRECT  Physical Exam:    Wt Readings from Last 3 Encounters:  06/02/19 200 lb (90.7 kg)  03/09/12 195 lb (88.5 kg)  03/03/12 195 lb (88.5 kg)    VS:  BP 138/88   Pulse 78   Ht 5\' 5"  (1.651 m)   Wt 200 lb (90.7 kg)   SpO2 97%   BMI 33.28 kg/m  , BMI Body mass index is 33.28 kg/m. GENERAL:  Well appearing HEENT: Pupils equal round and reactive, fundi not visualized, oral mucosa unremarkable NECK:  No jugular venous distention, waveform within normal limits, carotid upstroke brisk and symmetric, no bruits LUNGS:  Clear to auscultation bilaterally HEART:  RRR.  PMI not displaced or sustained,S1 and S2 within normal limits, no S3, no S4, no clicks, no rubs, no murmurs ABD:  Flat, positive bowel sounds normal in frequency in pitch, no bruits, no rebound, no guarding, no midline pulsatile mass, no hepatomegaly, no splenomegaly EXT:  2 plus pulses throughout, no edema, no cyanosis no clubbing SKIN:  No rashes no nodules NEURO:  Cranial nerves II through XII grossly intact, motor grossly intact throughout PSYCH:  Cognitively intact, oriented to person place and time   ASSESSMENT:    1. Essential hypertension     PLAN:    # Essential hypertension: Blood pressure is slightly above goal.  She should be consistently <130/80.  She is tolerating HCTZ/losartan well.  Will check a BMP given that it doesn't think it has been checked since starting HCTZ.  She will work on increasing her exercise to at least 150 minutes weekly.  Limit sodium intake.  She will track her BP at home and bring to PharmD follow up. She is interested in enrolling in the PREP program through the [facility]. Ideally her BP will be controlled with diet and exercise rather than having to titrate her mediations.     Disposition:    FU with PharmD in 1 month .  F/u with Vivienne Sangiovanni C. Oval Linsey, MD, [facility] in 4 months.    Medication  Adjustments/Labs and Tests Ordered: Current medicines are reviewed at length with the patient today.  Concerns regarding medicines are outlined above.  Orders Placed This Encounter  Procedures  . Basic metabolic panel  . EKG 12-Lead   No orders of the defined types were placed in this encounter.    Signed, Skeet Latch, MD  06/02/2019 5:06 PM    Mobile

## 2019-06-02 NOTE — Patient Instructions (Addendum)
Medication Instructions:  Your physician recommends that you continue on your current medications as directed. Please refer to the Current Medication list given to you today.   Labwork: BMET SOON    Testing/Procedures: NONE   Follow-Up: Your physician recommends that you schedule a follow-up appointment in: [place] 07/06/19 AT 9:00 AM   Your physician recommends that you schedule a follow-up appointment in: 4 MONTHS WITH DR [place], THE OFFICE WILL CALL YOU WITH DATE AND TIME    You will receive a phone call from the [facility] to schedule an initial assessment.  Special Instructions:  MONITOR YOUR BLOOD PRESSURE TWICE A DAY, LOG IN BOOK PROVIDED. BRING YOUR BOOK AND BLOOD PRESSURE MACHINE TO YOUR FOLLOW UP IN 1 MONTH   DASH Eating Plan DASH stands for "Dietary Approaches to Stop Hypertension." The DASH eating plan is a healthy eating plan that has been shown to reduce high blood pressure (hypertension). It may also reduce your risk for type 2 diabetes, heart disease, and stroke. The DASH eating plan may also help with weight loss. What are tips for following this plan?  General guidelines  Avoid eating more than 2,300 mg (milligrams) of salt (sodium) a day. If you have hypertension, you may need to reduce your sodium intake to 1,500 mg a day.  Limit alcohol intake to no more than 1 drink a day for nonpregnant women and 2 drinks a day for men. One drink equals 12 oz of beer, 5 oz of wine, or 1 oz of hard liquor.  Work with your health care provider to maintain a healthy body weight or to lose weight. Ask what an ideal weight is for you.  Get at least 30 minutes of exercise that causes your heart to beat faster (aerobic exercise) most days of the week. Activities may include walking, swimming, or biking.  Work with your health care provider or diet and nutrition specialist (dietitian) to adjust your eating plan to your individual calorie needs.  Reading food labels   Check food labels for the amount of sodium per serving. Choose foods with less than 5 percent of the Daily Value of sodium. Generally, foods with less than 300 mg of sodium per serving fit into this eating plan.  To find whole grains, look for the word "whole" as the first word in the ingredient list. Shopping  Buy products labeled as "low-sodium" or "no salt added."  Buy fresh foods. Avoid canned foods and premade or frozen meals. Cooking  Avoid adding salt when cooking. Use salt-free seasonings or herbs instead of table salt or sea salt. Check with your health care provider or pharmacist before using salt substitutes.  Do not fry foods. Cook foods using healthy methods such as baking, boiling, grilling, and broiling instead.  Cook with heart-healthy oils, such as olive, canola, soybean, or sunflower oil. Meal planning  Eat a balanced diet that includes: ? 5 or more servings of fruits and vegetables each day. At each meal, try to fill half of your plate with fruits and vegetables. ? Up to 6-8 servings of whole grains each day. ? Less than 6 oz of lean meat, poultry, or fish each day. A 3-oz serving of meat is about the same size as a deck of cards. One egg equals 1 oz. ? 2 servings of low-fat dairy each day. ? A serving of nuts, seeds, or beans 5 times each week. ? Heart-healthy fats. Healthy fats called Omega-3 fatty acids  are found in foods such as flaxseeds and coldwater fish, like sardines, salmon, and mackerel.  Limit how much you eat of the following: ? Canned or prepackaged foods. ? Food that is high in trans fat, such as fried foods. ? Food that is high in saturated fat, such as fatty meat. ? Sweets, desserts, sugary drinks, and other foods with added sugar. ? Full-fat dairy products.  Do not salt foods before eating.  Try to eat at least 2 vegetarian meals each week.  Eat more home-cooked food and less restaurant, buffet, and fast food.  When  eating at a restaurant, ask that your food be prepared with less salt or no salt, if possible. What foods are recommended? The items listed may not be a complete list. Talk with your dietitian about what dietary choices are best for you. Grains Whole-grain or whole-wheat bread. Whole-grain or whole-wheat pasta. Brown rice. [place]. Bulgur. Whole-grain and low-sodium cereals. Pita bread. Low-fat, low-sodium crackers. Whole-wheat flour tortillas. Vegetables Fresh or frozen vegetables (raw, steamed, roasted, or grilled). Low-sodium or reduced-sodium tomato and vegetable juice. Low-sodium or reduced-sodium tomato sauce and tomato paste. Low-sodium or reduced-sodium canned vegetables. Fruits All fresh, dried, or frozen fruit. Canned fruit in natural juice (without added sugar). Meat and other protein foods Skinless chicken or [place]. Ground chicken or [place]. Pork with fat trimmed off. Fish and seafood. Egg whites. Dried beans, peas, or lentils. Unsalted nuts, nut butters, and seeds. Unsalted canned beans. Lean cuts of beef with fat trimmed off. Low-sodium, lean deli meat. Dairy Low-fat (1%) or fat-free (skim) milk. Fat-free, low-fat, or reduced-fat cheeses. Nonfat, low-sodium ricotta or cottage cheese. Low-fat or nonfat yogurt. Low-fat, low-sodium cheese. Fats and oils Soft margarine without trans fats. Vegetable oil. Low-fat, reduced-fat, or light mayonnaise and salad dressings (reduced-sodium). Canola, safflower, olive, soybean, and sunflower oils. Avocado. Seasoning and other foods Herbs. Spices. Seasoning mixes without salt. Unsalted popcorn and pretzels. Fat-free sweets. What foods are not recommended? The items listed may not be a complete list. Talk with your dietitian about what dietary choices are best for you. Grains Baked goods made with fat, such as croissants, muffins, or some breads. Dry pasta or rice meal packs. Vegetables Creamed or fried vegetables. Vegetables in a cheese  sauce. Regular canned vegetables (not low-sodium or reduced-sodium). Regular canned tomato sauce and paste (not low-sodium or reduced-sodium). Regular tomato and vegetable juice (not low-sodium or reduced-sodium). Angie Fava. Olives. Fruits Canned fruit in a light or heavy syrup. Fried fruit. Fruit in cream or butter sauce. Meat and other protein foods Fatty cuts of meat. Ribs. Fried meat. Berniece Salines. Sausage. Bologna and other processed lunch meats. Salami. Fatback. Hotdogs. Bratwurst. Salted nuts and seeds. Canned beans with added salt. Canned or smoked fish. Whole eggs or egg yolks. Chicken or [place] with skin. Dairy Whole or 2% milk, cream, and half-and-half. Whole or full-fat cream cheese. Whole-fat or sweetened yogurt. Full-fat cheese. Nondairy creamers. Whipped toppings. Processed cheese and cheese spreads. Fats and oils Butter. Stick margarine. Lard. Shortening. Ghee. Bacon fat. Tropical oils, such as coconut, palm kernel, or palm oil. Seasoning and other foods Salted popcorn and pretzels. Onion salt, garlic salt, seasoned salt, table salt, and sea salt. Worcestershire sauce. Tartar sauce. Barbecue sauce. Teriyaki sauce. Soy sauce, including reduced-sodium. Steak sauce. Canned and packaged gravies. Fish sauce. Oyster sauce. Cocktail sauce. Horseradish that you find on the shelf. Ketchup. Mustard. Meat flavorings and tenderizers. Bouillon cubes. Hot sauce and Tabasco sauce. Premade or packaged marinades. Premade or packaged taco  seasonings. Relishes. Regular salad dressings. Where to find more information:  National Heart, Lung, and [place]: https://wilson-eaton.com/  American Heart Association: www.heart.org Summary  The DASH eating plan is a healthy eating plan that has been shown to reduce high blood pressure (hypertension). It may also reduce your risk for type 2 diabetes, heart disease, and stroke.  With the DASH eating plan, you should limit salt (sodium) intake to 2,300 mg a day. If you  have hypertension, you may need to reduce your sodium intake to 1,500 mg a day.  When on the DASH eating plan, aim to eat more fresh fruits and vegetables, whole grains, lean proteins, low-fat dairy, and heart-healthy fats.  Work with your health care provider or diet and nutrition specialist (dietitian) to adjust your eating plan to your individual calorie needs. This information is not intended to replace advice given to you by your health care provider. Make sure you discuss any questions you have with your health care provider. Document Released: 02/21/2011 Document Revised: 02/14/2017 Document Reviewed: 02/26/2016 Elsevier Patient Education  2020 [employer].

## 2019-06-09 LAB — BASIC METABOLIC PANEL
BUN/Creatinine Ratio: 14 (ref 9–23)
BUN: 12 mg/dL (ref 6–24)
CO2: 28 mmol/L (ref 20–29)
Calcium: 9.2 mg/dL (ref 8.7–10.2)
Chloride: 100 mmol/L (ref 96–106)
Creatinine, Ser: 0.86 mg/dL (ref 0.57–1.00)
GFR calc Af Amer: 91 mL/min/{1.73_m2} (ref >59)
GFR calc non Af Amer: 79 mL/min/{1.73_m2} (ref >59)
Glucose: 82 mg/dL (ref 65–99)
Potassium: 4.1 mmol/L (ref 3.5–5.2)
Sodium: 141 mmol/L (ref 134–144)

## 2019-07-06 ENCOUNTER — Ambulatory Visit: Payer: BC Managed Care – PPO
